# Patient Record
Sex: Male | Born: 1985 | Race: White | Hispanic: No | Marital: Single | Smoking: Never smoker
Health system: Southern US, Community
[De-identification: ages and names within clinical notes are randomized; demographics above are authoritative.]

## PROBLEM LIST (undated history)

## (undated) HISTORY — PX: MOUTH SURGERY: SHX715

---

## 2008-05-11 ENCOUNTER — Emergency Department (HOSPITAL_COMMUNITY): Admission: EM | Admit: 2008-05-11 | Discharge: 2008-05-11 | Payer: Self-pay | Admitting: Emergency Medicine

## 2013-05-10 ENCOUNTER — Emergency Department (HOSPITAL_COMMUNITY)
Admission: EM | Admit: 2013-05-10 | Discharge: 2013-05-10 | Disposition: A | Discharge status: Home / Self Care | Payer: Self-pay | Attending: Emergency Medicine | Admitting: Emergency Medicine

## 2013-05-10 ENCOUNTER — Encounter (HOSPITAL_COMMUNITY): Payer: Self-pay | Admitting: Emergency Medicine

## 2013-05-10 DIAGNOSIS — L03211 Cellulitis of face: Secondary | ICD-10-CM | POA: Insufficient documentation

## 2013-05-10 DIAGNOSIS — L0201 Cutaneous abscess of face: Secondary | ICD-10-CM | POA: Insufficient documentation

## 2013-05-10 MED ORDER — CEPHALEXIN 500 MG PO CAPS: 500.0000 mg | ORAL_CAPSULE | Freq: Four times a day (QID) | ORAL | Status: DC

## 2013-05-10 MED ORDER — SULFAMETHOXAZOLE-TMP DS 800-160 MG PO TABS
1.0000 | ORAL_TABLET | Freq: Once | ORAL | Status: AC
  Administered 2013-05-10: 1 via ORAL
  Filled 2013-05-10: qty 1

## 2013-05-10 MED ORDER — CEPHALEXIN 250 MG PO CAPS
500.0000 mg | ORAL_CAPSULE | Freq: Once | ORAL | Status: AC
  Administered 2013-05-10: 500 mg via ORAL
  Filled 2013-05-10: qty 2

## 2013-05-10 MED ORDER — SULFAMETHOXAZOLE-TRIMETHOPRIM 800-160 MG PO TABS: 1.0000 | ORAL_TABLET | Freq: Two times a day (BID) | ORAL | Status: DC

## 2013-05-10 NOTE — ED Provider Notes (Signed)
History     CSN: [id number]  Arrival date & time 05/10/13  1310   First MD Initiated Contact with Patient 05/10/13 1347      Chief Complaint  Patient presents with  . Facial Swelling    (Consider location/radiation/quality/duration/timing/severity/associated sxs/prior treatment) HPI Eric Bender is a 27 y.o. male who presents to ED with complaint of facial swelling. States swelling started two days ago when he noted a pimple on his right cheek. States since then swelling is worsening. States when squeezed nothing came out. Denies fever, chills, malaise. States tried warm compresses with no improvement. No hx of the same.    History reviewed. No pertinent past medical history.  No past surgical history on file.  No family history on file.  History  Substance Use Topics  . Smoking status: Never Smoker   . Smokeless tobacco: Not on file  . Alcohol Use: Yes      Review of Systems  Constitutional: Negative for fever and chills.  HENT: Positive for facial swelling. Negative for congestion, sore throat, neck pain, neck stiffness and dental problem.   Respiratory: Negative.   Cardiovascular: Negative.   Skin: Positive for color change and wound.  Neurological: Negative for weakness and headaches.    Allergies  Penicillins  Home Medications  No current outpatient prescriptions on file.  BP 137/86  Pulse 81  Temp(Src) 98.3 F (36.8 C)  Resp 16  SpO2 99%  Physical Exam  HENT:  Head: Normocephalic and atraumatic.  Right Ear: External ear normal.  Left Ear: External ear normal.  Nose: Nose normal.  Mouth/Throat: Oropharynx is clear and moist.  About 3cm area of swelling, erythema, induration to the right maxilla. Tender to palpation. Unremarkable dentition  Neck: Neck supple.  Cardiovascular: Normal rate, regular rhythm and normal heart sounds.   Pulmonary/Chest: Effort normal and breath sounds normal. No respiratory distress.  Lymphadenopathy:    He has no  cervical adenopathy.  Neurological: He is alert.  Skin: Skin is warm and dry.    ED Course  Procedures (including critical care time)  Labs Reviewed - No data to display No results found.   1. Facial abscess       MDM  Pt with small abscess vs cellulitis of the right face. Explained to pt that we normally have to I&D these to make them better. Given this is on his face, and at this time, swelling is minimal, he is afebrile, non toxic, will try antibiotics first, with close precautions to return if worsening. Started on clindamycin first, which pt states  He cannot aford. Will start on keflex and bactrim. He has no sinus complaints, no dental issues, no other problems.   Filed Vitals:   05/10/13 1316  BP: 137/86  Pulse: 81  Temp: 98.3 F (36.8 C)  Resp: 16  SpO2: 99%           Lottie Mussel, PA-C 05/10/13 1546

## 2013-05-10 NOTE — ED Notes (Signed)
Rt side of face swelling x2 days thought it was pimple and mashed it

## 2013-05-10 NOTE — ED Notes (Signed)
Patient has right facial swelling since yesterday.  Patient claims he thought it was a pimple coming up yesterday, but he woke up with morning with swelling in R cheek area.

## 2013-05-12 NOTE — ED Provider Notes (Signed)
Medical screening examination/treatment/procedure(s) were performed by non-physician practitioner and as supervising physician I was immediately available for consultation/collaboration.   Richardean Canal, MD 05/12/13 1534

## 2015-09-01 ENCOUNTER — Encounter (HOSPITAL_COMMUNITY): Payer: Self-pay | Admitting: Emergency Medicine

## 2015-09-01 ENCOUNTER — Emergency Department (HOSPITAL_COMMUNITY)
Admission: EM | Admit: 2015-09-01 | Discharge: 2015-09-01 | Disposition: A | Discharge status: Home / Self Care | Payer: Self-pay | Attending: Emergency Medicine | Admitting: Emergency Medicine

## 2015-09-01 ENCOUNTER — Emergency Department (HOSPITAL_COMMUNITY): Payer: Self-pay

## 2015-09-01 DIAGNOSIS — Y998 Other external cause status: Secondary | ICD-10-CM | POA: Insufficient documentation

## 2015-09-01 DIAGNOSIS — S62316A Displaced fracture of base of fifth metacarpal bone, right hand, initial encounter for closed fracture: Secondary | ICD-10-CM | POA: Insufficient documentation

## 2015-09-01 DIAGNOSIS — Y9289 Other specified places as the place of occurrence of the external cause: Secondary | ICD-10-CM | POA: Insufficient documentation

## 2015-09-01 DIAGNOSIS — W2209XA Striking against other stationary object, initial encounter: Secondary | ICD-10-CM | POA: Insufficient documentation

## 2015-09-01 DIAGNOSIS — S6291XA Unspecified fracture of right wrist and hand, initial encounter for closed fracture: Secondary | ICD-10-CM

## 2015-09-01 DIAGNOSIS — S62314A Displaced fracture of base of fourth metacarpal bone, right hand, initial encounter for closed fracture: Secondary | ICD-10-CM | POA: Insufficient documentation

## 2015-09-01 DIAGNOSIS — Y9389 Activity, other specified: Secondary | ICD-10-CM | POA: Insufficient documentation

## 2015-09-01 MED ORDER — HYDROCODONE-ACETAMINOPHEN 5-325 MG PO TABS
1.0000 | ORAL_TABLET | Freq: Once | ORAL | Status: DC
  Filled 2015-09-01: qty 1

## 2015-09-01 MED ORDER — NAPROXEN 500 MG PO TABS: 500.0000 mg | ORAL_TABLET | Freq: Two times a day (BID) | ORAL | Status: DC

## 2015-09-01 NOTE — ED Provider Notes (Signed)
CSN: [id number]     Arrival date & time 09/01/15  4098 History   First MD Initiated Contact with Patient 09/01/15 [phone]     Chief Complaint  Patient presents with  . Hand Injury     (Consider location/radiation/quality/duration/timing/severity/associated sxs/prior Treatment) Patient is a 29 y.o. male presenting with hand injury. The history is provided by the patient.  Hand Injury Associated symptoms: no fever and no neck pain    right hand dominant complaint of pain to the right hand following hitting a wall at about 6 in the morning. Patient with abrasions to the knuckles of the right hand fourth and fifth digit. Patient with pain along that aspect of the hand. Denies any wrist pain and elbow pain or shoulder pain. No other injuries. States that the pain is 8 out of 10.  History reviewed. No pertinent past medical history. History reviewed. No pertinent past surgical history. No family history on file. Social History  Substance Use Topics  . Smoking status: Never Smoker   . Smokeless tobacco: None  . Alcohol Use: Yes    Review of Systems  Constitutional: Negative for fever.  HENT: Negative for congestion.   Eyes: Negative for redness.  Respiratory: Negative for shortness of breath.   Cardiovascular: Negative for chest pain.  Gastrointestinal: Negative for abdominal pain.  Musculoskeletal: Negative for neck pain.  Skin: Positive for wound.  Neurological: Negative for headaches.  Hematological: Does not bruise/bleed easily.  Psychiatric/Behavioral: Negative for confusion.      Allergies  Penicillins and Sulfa antibiotics  Home Medications   Prior to Admission medications   Medication Sig Start Date End Date Taking? Authorizing Provider  acetaminophen (TYLENOL) 500 MG tablet Take 500 mg by mouth every 8 (eight) hours as needed for mild pain, moderate pain, fever or headache.   Yes Historical Provider, MD  pseudoephedrine (SUDAFED) 30 MG tablet Take 30 mg by mouth every  8 (eight) hours as needed for congestion.   Yes Historical Provider, MD  cephALEXin (KEFLEX) 500 MG capsule Take 1 capsule (500 mg total) by mouth 4 (four) times daily. Patient not taking: Reported on 09/01/2015 05/10/13   Tatyana Kirichenko, PA-C  naproxen (NAPROSYN) 500 MG tablet Take 1 tablet (500 mg total) by mouth 2 (two) times daily. 09/01/15   Vanetta Mulders, MD  sulfamethoxazole-trimethoprim (BACTRIM DS) 800-160 MG per tablet Take 1 tablet by mouth 2 (two) times daily. Patient not taking: Reported on 09/01/2015 05/10/13   Tatyana Kirichenko, PA-C   BP 129/65 mmHg  Pulse 102  Temp(Src) 98.2 F (36.8 C) (Oral)  Resp 15  Ht  (1.854 m)  Wt 200 lb (90.719 kg)  BMI 26.39 kg/m2  SpO2 98% Physical Exam  Constitutional: He is oriented to person, place, and time. He appears well-developed and well-nourished. No distress.  HENT:  Head: Normocephalic and atraumatic.  Mouth/Throat: Oropharynx is clear and moist.  Eyes: Conjunctivae and EOM are normal. Pupils are equal, round, and reactive to light.  Neck: Normal range of motion.  Cardiovascular: Normal rate, regular rhythm and normal heart sounds.   Pulmonary/Chest: Effort normal and breath sounds normal. No respiratory distress.  Abdominal: Soft. Bowel sounds are normal. There is no tenderness.  Musculoskeletal: He exhibits edema and tenderness.  Patient was swelling over the fourth and fifth metacarpal area of the right hand. Cap refill to those fingers is intact at 1 second. Good range of motion of the fingers. Tenderness through palpation of all of the metacarpals. No significant tenderness at  the wrist no tenderness at the elbow. Radial pulses 2+. Sensation intact. Superficial abrasions to the fourth and fifth MP joint area dorsally  Neurological: He is alert and oriented to person, place, and time. No cranial nerve deficit. He exhibits normal muscle tone. Coordination normal.  Skin: Skin is warm. No erythema.  Nursing note and vitals  reviewed.   ED Course  Procedures (including critical care time) Labs Review Labs Reviewed - No data to display  Imaging Review Dg Hand Complete Right  09/01/2015   CLINICAL DATA:  Pain in right hand on posterior and medial, ulnar, aspect, between 4th and 5th MCP joints; pt states that he punched a wall couple hours ago; no previous right hand injury  EXAM: RIGHT HAND - COMPLETE 3+ VIEW  COMPARISON:  None.  FINDINGS: Comminuted fracture base of the fifth metacarpal. Apex dorsal angulation and mild displacement. Minimally displaced fracture base of the fourth metacarpal.  IMPRESSION: Fractures at the base of fourth and fifth metacarpals   Electronically Signed   By: Esperanza Heir M.D.   On: 09/01/2015 07:22   I have personally reviewed and evaluated these images and lab results as part of my medical decision-making.   EKG Interpretation None      MDM   Final diagnoses:  Hand fracture, right, closed, initial encounter    Patient punched a wall occurred about 6 in the morning. X-rays reveal fractures at the base of the fourth and fifth metacarpal. Sensation intact distally swelling to the ulnar side of the hand. Reasonable range of motion of the fingers. Radial pulse 2+. Will splint and refer to hand surgery.  Patient is from the [place] area and may contact orthopedics down that way for follow-up in that area that would be fine. Offered patient hydrocodone for the pain but he did not one that which is fine we'll give him a prescription for Naprosyn.    Vanetta Mulders, MD 09/01/15 [phone]

## 2015-09-01 NOTE — ED Notes (Signed)
Patient awaiting ortho

## 2015-09-01 NOTE — ED Notes (Signed)
MD at bedside. 

## 2015-09-01 NOTE — ED Notes (Signed)
Ortho at the bedside.

## 2015-09-01 NOTE — ED Notes (Signed)
Pt from home c/o right hand pain from punching a wall. Bruisng, redness, and swelling noted. Pt tearful in triage.

## 2015-09-01 NOTE — Discharge Instructions (Signed)

## 2015-09-01 NOTE — ED Notes (Signed)
In to give patient pain medication.  Patient refused.  States "yall are trying to get me addicted to fucking pain medication".  Explained to patient that was not what we were trying to do but that we were trying to help with his pain while here.  Patient frustrated and tearful.  States he is a [employer], [employer].  Per friend at bedside patient has been drinking and states "that's why he is acting like this".

## 2015-09-04 ENCOUNTER — Encounter (HOSPITAL_BASED_OUTPATIENT_CLINIC_OR_DEPARTMENT_OTHER): Payer: Self-pay | Admitting: *Deleted

## 2015-09-04 ENCOUNTER — Other Ambulatory Visit: Payer: Self-pay | Admitting: Orthopedic Surgery

## 2015-09-06 ENCOUNTER — Encounter (HOSPITAL_BASED_OUTPATIENT_CLINIC_OR_DEPARTMENT_OTHER)
Admission: RE | Disposition: A | Discharge status: Home / Self Care | Payer: Self-pay | Source: Ambulatory Visit | Attending: Orthopedic Surgery

## 2015-09-06 ENCOUNTER — Ambulatory Visit (HOSPITAL_BASED_OUTPATIENT_CLINIC_OR_DEPARTMENT_OTHER): Payer: Self-pay | Admitting: Certified Registered"

## 2015-09-06 ENCOUNTER — Encounter (HOSPITAL_BASED_OUTPATIENT_CLINIC_OR_DEPARTMENT_OTHER): Payer: Self-pay | Admitting: Anesthesiology

## 2015-09-06 ENCOUNTER — Ambulatory Visit (HOSPITAL_BASED_OUTPATIENT_CLINIC_OR_DEPARTMENT_OTHER)
Admission: RE | Admit: 2015-09-06 | Discharge: 2015-09-06 | Disposition: A | Discharge status: Home / Self Care | Payer: Self-pay | Source: Ambulatory Visit | Attending: Orthopedic Surgery | Admitting: Orthopedic Surgery

## 2015-09-06 DIAGNOSIS — Z880 Allergy status to penicillin: Secondary | ICD-10-CM | POA: Insufficient documentation

## 2015-09-06 DIAGNOSIS — S62316A Displaced fracture of base of fifth metacarpal bone, right hand, initial encounter for closed fracture: Secondary | ICD-10-CM | POA: Insufficient documentation

## 2015-09-06 DIAGNOSIS — W2201XA Walked into wall, initial encounter: Secondary | ICD-10-CM | POA: Insufficient documentation

## 2015-09-06 DIAGNOSIS — S62314A Displaced fracture of base of fourth metacarpal bone, right hand, initial encounter for closed fracture: Secondary | ICD-10-CM | POA: Insufficient documentation

## 2015-09-06 HISTORY — PX: CLOSED REDUCTION FINGER WITH PERCUTANEOUS PINNING: SHX5612

## 2015-09-06 SURGERY — CLOSED REDUCTION, FINGER, WITH PERCUTANEOUS PINNING
Anesthesia: General | Site: Hand | Laterality: Right

## 2015-09-06 MED ORDER — MIDAZOLAM HCL 2 MG/2ML IJ SOLN
INTRAMUSCULAR | Status: AC
  Filled 2015-09-06: qty 4

## 2015-09-06 MED ORDER — ONDANSETRON HCL 4 MG/2ML IJ SOLN
INTRAMUSCULAR | Status: DC | PRN
  Administered 2015-09-06: 4 mg via INTRAVENOUS

## 2015-09-06 MED ORDER — OXYCODONE HCL 5 MG/5ML PO SOLN: 5.0000 mg | Freq: Once | ORAL | Status: AC | PRN

## 2015-09-06 MED ORDER — VANCOMYCIN HCL IN DEXTROSE 1-5 GM/200ML-% IV SOLN
INTRAVENOUS | Status: AC
  Filled 2015-09-06: qty 200

## 2015-09-06 MED ORDER — LACTATED RINGERS IV SOLN
INTRAVENOUS | Status: DC
  Administered 2015-09-06 (×2): via INTRAVENOUS

## 2015-09-06 MED ORDER — FENTANYL CITRATE (PF) 100 MCG/2ML IJ SOLN
INTRAMUSCULAR | Status: AC
  Filled 2015-09-06: qty 4

## 2015-09-06 MED ORDER — FENTANYL CITRATE (PF) 100 MCG/2ML IJ SOLN
50.0000 ug | INTRAMUSCULAR | Status: AC | PRN
  Administered 2015-09-06 (×2): 25 ug via INTRAVENOUS
  Administered 2015-09-06: 100 ug via INTRAVENOUS

## 2015-09-06 MED ORDER — KETOROLAC TROMETHAMINE 30 MG/ML IJ SOLN: 30.0000 mg | Freq: Once | INTRAMUSCULAR | Status: DC

## 2015-09-06 MED ORDER — BUPIVACAINE HCL (PF) 0.25 % IJ SOLN
INTRAMUSCULAR | Status: AC
  Filled 2015-09-06: qty 30

## 2015-09-06 MED ORDER — HYDROMORPHONE HCL 1 MG/ML IJ SOLN
INTRAMUSCULAR | Status: AC
  Filled 2015-09-06: qty 1

## 2015-09-06 MED ORDER — DEXAMETHASONE SODIUM PHOSPHATE 4 MG/ML IJ SOLN
INTRAMUSCULAR | Status: DC | PRN
  Administered 2015-09-06: 10 mg via INTRAVENOUS

## 2015-09-06 MED ORDER — MIDAZOLAM HCL 2 MG/2ML IJ SOLN
1.0000 mg | INTRAMUSCULAR | Status: DC | PRN
  Administered 2015-09-06: 2 mg via INTRAVENOUS

## 2015-09-06 MED ORDER — DEXAMETHASONE SODIUM PHOSPHATE 10 MG/ML IJ SOLN
INTRAMUSCULAR | Status: AC
  Filled 2015-09-06: qty 1

## 2015-09-06 MED ORDER — VANCOMYCIN HCL IN DEXTROSE 1-5 GM/200ML-% IV SOLN
1000.0000 mg | INTRAVENOUS | Status: AC
  Administered 2015-09-06: 1000 mg via INTRAVENOUS

## 2015-09-06 MED ORDER — PROPOFOL 10 MG/ML IV BOLUS
INTRAVENOUS | Status: DC | PRN
  Administered 2015-09-06: 200 mg via INTRAVENOUS

## 2015-09-06 MED ORDER — HYDROCODONE-ACETAMINOPHEN 5-325 MG PO TABS: ORAL_TABLET | ORAL | Status: AC

## 2015-09-06 MED ORDER — PROMETHAZINE HCL 25 MG/ML IJ SOLN: 6.2500 mg | INTRAMUSCULAR | Status: DC | PRN

## 2015-09-06 MED ORDER — OXYCODONE HCL 5 MG PO TABS
ORAL_TABLET | ORAL | Status: AC
  Filled 2015-09-06: qty 1

## 2015-09-06 MED ORDER — SUCCINYLCHOLINE CHLORIDE 20 MG/ML IJ SOLN
INTRAMUSCULAR | Status: AC
  Filled 2015-09-06: qty 1

## 2015-09-06 MED ORDER — LIDOCAINE HCL (CARDIAC) 20 MG/ML IV SOLN
INTRAVENOUS | Status: AC
  Filled 2015-09-06: qty 5

## 2015-09-06 MED ORDER — CHLORHEXIDINE GLUCONATE 4 % EX LIQD: 60.0000 mL | Freq: Once | CUTANEOUS | Status: DC

## 2015-09-06 MED ORDER — GLYCOPYRROLATE 0.2 MG/ML IJ SOLN: 0.2000 mg | Freq: Once | INTRAMUSCULAR | Status: DC | PRN

## 2015-09-06 MED ORDER — LIDOCAINE HCL (CARDIAC) 20 MG/ML IV SOLN
INTRAVENOUS | Status: DC | PRN
  Administered 2015-09-06: 50 mg via INTRAVENOUS

## 2015-09-06 MED ORDER — PROPOFOL 10 MG/ML IV BOLUS
INTRAVENOUS | Status: AC
  Filled 2015-09-06: qty 20

## 2015-09-06 MED ORDER — OXYCODONE HCL 5 MG PO TABS
5.0000 mg | ORAL_TABLET | Freq: Once | ORAL | Status: AC | PRN
  Administered 2015-09-06: 5 mg via ORAL

## 2015-09-06 MED ORDER — SCOPOLAMINE 1 MG/3DAYS TD PT72: 1.0000 | MEDICATED_PATCH | Freq: Once | TRANSDERMAL | Status: DC | PRN

## 2015-09-06 MED ORDER — BUPIVACAINE HCL (PF) 0.25 % IJ SOLN
INTRAMUSCULAR | Status: DC | PRN
  Administered 2015-09-06: 9 mL

## 2015-09-06 MED ORDER — HYDROMORPHONE HCL 1 MG/ML IJ SOLN
0.2500 mg | INTRAMUSCULAR | Status: DC | PRN
  Administered 2015-09-06: 0.25 mg via INTRAVENOUS
  Administered 2015-09-06 (×2): 0.5 mg via INTRAVENOUS
  Administered 2015-09-06: 0.25 mg via INTRAVENOUS

## 2015-09-06 MED ORDER — DIPHENHYDRAMINE HCL 50 MG/ML IJ SOLN
INTRAMUSCULAR | Status: AC
  Filled 2015-09-06: qty 1

## 2015-09-06 MED ORDER — DIPHENHYDRAMINE HCL 50 MG/ML IJ SOLN
25.0000 mg | Freq: Once | INTRAMUSCULAR | Status: AC
  Administered 2015-09-06: 25 mg via INTRAVENOUS

## 2015-09-06 SURGICAL SUPPLY — 46 items
BANDAGE ELASTIC 3 VELCRO ST LF (GAUZE/BANDAGES/DRESSINGS) ×3 IMPLANT
BLADE SURG 15 STRL LF DISP TIS (BLADE) IMPLANT
BLADE SURG 15 STRL SS (BLADE)
BNDG ELASTIC 2 VLCR STRL LF (GAUZE/BANDAGES/DRESSINGS) IMPLANT
BNDG ESMARK 4X9 LF (GAUZE/BANDAGES/DRESSINGS) ×3 IMPLANT
BNDG GAUZE ELAST 4 BULKY (GAUZE/BANDAGES/DRESSINGS) ×3 IMPLANT
CHLORAPREP W/TINT 26ML (MISCELLANEOUS) ×3 IMPLANT
CORDS BIPOLAR (ELECTRODE) IMPLANT
COVER BACK TABLE 60X90IN (DRAPES) ×3 IMPLANT
COVER MAYO STAND STRL (DRAPES) ×3 IMPLANT
CUFF TOURNIQUET SINGLE 18IN (TOURNIQUET CUFF) ×3 IMPLANT
DRAPE EXTREMITY T 121X128X90 (DRAPE) ×3 IMPLANT
DRAPE OEC MINIVIEW 54X84 (DRAPES) ×3 IMPLANT
DRAPE SURG 17X23 STRL (DRAPES) ×3 IMPLANT
GAUZE SPONGE 4X4 12PLY STRL (GAUZE/BANDAGES/DRESSINGS) ×3 IMPLANT
GAUZE XEROFORM 1X8 LF (GAUZE/BANDAGES/DRESSINGS) ×3 IMPLANT
GLOVE BIO SURGEON STRL SZ7.5 (GLOVE) ×3 IMPLANT
GLOVE BIOGEL PI IND STRL 7.0 (GLOVE) ×2 IMPLANT
GLOVE BIOGEL PI IND STRL 8 (GLOVE) ×1 IMPLANT
GLOVE BIOGEL PI IND STRL 8.5 (GLOVE) IMPLANT
GLOVE BIOGEL PI INDICATOR 7.0 (GLOVE) ×4
GLOVE BIOGEL PI INDICATOR 8 (GLOVE) ×2
GLOVE BIOGEL PI INDICATOR 8.5 (GLOVE)
GLOVE ECLIPSE 6.5 STRL STRAW (GLOVE) ×3 IMPLANT
GLOVE SURG ORTHO 8.0 STRL STRW (GLOVE) IMPLANT
GOWN STRL REUS W/ TWL LRG LVL3 (GOWN DISPOSABLE) ×1 IMPLANT
GOWN STRL REUS W/TWL LRG LVL3 (GOWN DISPOSABLE) ×2
GOWN STRL REUS W/TWL XL LVL3 (GOWN DISPOSABLE) ×3 IMPLANT
K-WIRE .045X4 (WIRE) ×9 IMPLANT
NEEDLE HYPO 25X1 1.5 SAFETY (NEEDLE) IMPLANT
NS IRRIG 1000ML POUR BTL (IV SOLUTION) ×3 IMPLANT
PACK BASIN DAY SURGERY FS (CUSTOM PROCEDURE TRAY) ×3 IMPLANT
PAD CAST 3X4 CTTN HI CHSV (CAST SUPPLIES) ×1 IMPLANT
PAD CAST 4YDX4 CTTN HI CHSV (CAST SUPPLIES) IMPLANT
PADDING CAST COTTON 3X4 STRL (CAST SUPPLIES) ×2
PADDING CAST COTTON 4X4 STRL (CAST SUPPLIES)
SLEEVE SCD COMPRESS KNEE MED (MISCELLANEOUS) ×3 IMPLANT
SPLINT PLASTER CAST XFAST 3X15 (CAST SUPPLIES) ×20 IMPLANT
SPLINT PLASTER XTRA FASTSET 3X (CAST SUPPLIES) ×40
STOCKINETTE 4X48 STRL (DRAPES) ×3 IMPLANT
SUT ETHILON 3 0 PS 1 (SUTURE) IMPLANT
SUT ETHILON 4 0 PS 2 18 (SUTURE) IMPLANT
SYR BULB 3OZ (MISCELLANEOUS) IMPLANT
SYR CONTROL 10ML LL (SYRINGE) ×3 IMPLANT
TOWEL OR 17X24 6PK STRL BLUE (TOWEL DISPOSABLE) ×3 IMPLANT
UNDERPAD 30X30 (UNDERPADS AND DIAPERS) IMPLANT

## 2015-09-06 NOTE — Transfer of Care (Signed)
Immediate Anesthesia Transfer of Care Note  Patient: Eric Bender  Procedure(s) Performed: Procedure(s): RIGHT RING AND SMALL METACARPAL CLOSED REDUCTION FINGER WITH PERCUTANEOUS PINNING  (Right)  Patient Location: PACU  Anesthesia Type:General  Level of Consciousness: awake, sedated and patient cooperative  Airway & Oxygen Therapy: Patient Spontanous Breathing and Patient connected to face mask oxygen  Post-op Assessment: Report given to RN and Post -op Vital signs reviewed and stable  Post vital signs: Reviewed and stable  Last Vitals:  Filed Vitals:   09/06/15 1223  BP: 118/73  Pulse: 88  Temp: 36.7 C  Resp: 20    Complications: No apparent anesthesia complications

## 2015-09-06 NOTE — Anesthesia Postprocedure Evaluation (Signed)
  Anesthesia Post-op Note  Patient: Eric Bender  Procedure(s) Performed: Procedure(s): RIGHT RING AND SMALL METACARPAL CLOSED REDUCTION FINGER WITH PERCUTANEOUS PINNING  (Right)  Patient Location: PACU  Anesthesia Type:General  Level of Consciousness: awake and alert   Airway and Oxygen Therapy: Patient Spontanous Breathing  Post-op Pain: none  Post-op Assessment: Post-op Vital signs reviewed              Post-op Vital Signs: Reviewed  Last Vitals:  Filed Vitals:   09/06/15 1530  BP: 127/84  Pulse: 87  Temp:   Resp: 20    Complications: No apparent anesthesia complications

## 2015-09-06 NOTE — Op Note (Signed)
[id number] 

## 2015-09-06 NOTE — Op Note (Signed)
Intra-operative fluoroscopic images in the AP, lateral, and oblique views were taken and evaluated by myself.  Reduction and hardware placement were confirmed.  There was no intraarticular penetration of permanent hardware.  

## 2015-09-06 NOTE — Discharge Instructions (Addendum)

## 2015-09-06 NOTE — Anesthesia Preprocedure Evaluation (Signed)
Anesthesia Evaluation  Patient identified by MRN, date of birth, ID band Patient awake    Reviewed: Allergy & Precautions, NPO status , Patient's Chart, lab work & pertinent test results  Airway Mallampati: I  TM Distance: >3 FB Neck ROM: Full    Dental  (+) Teeth Intact   Pulmonary neg pulmonary ROS,    breath sounds clear to auscultation       Cardiovascular negative cardio ROS   Rhythm:Regular Rate:Normal     Neuro/Psych negative neurological ROS     GI/Hepatic negative GI ROS, Neg liver ROS,   Endo/Other  negative endocrine ROS  Renal/GU negative Renal ROS     Musculoskeletal   Abdominal   Peds  Hematology negative hematology ROS (+)   Anesthesia Other Findings   Reproductive/Obstetrics                             Anesthesia Physical Anesthesia Plan  ASA: I  Anesthesia Plan: General   Post-op Pain Management:    Induction: Intravenous  Airway Management Planned: LMA  Additional Equipment:   Intra-op Plan:   Post-operative Plan:   Informed Consent: I have reviewed the patients History and Physical, chart, labs and discussed the procedure including the risks, benefits and alternatives for the proposed anesthesia with the patient or authorized representative who has indicated his/her understanding and acceptance.     Plan Discussed with: CRNA  Anesthesia Plan Comments:         Anesthesia Quick Evaluation

## 2015-09-06 NOTE — Brief Op Note (Signed)
09/06/2015  2:46 PM  PATIENT:  Eric Bender  29 y.o. male  PRE-OPERATIVE DIAGNOSIS:  right ring and small finger metacarpal fractures  S62.314A, S62.316A  POST-OPERATIVE DIAGNOSIS:  right ring and small finger metacarpal fractures  S62.314A, S62.316A  PROCEDURE:  Procedure(s): RIGHT RING AND SMALL METACARPAL CLOSED REDUCTION FINGER WITH PERCUTANEOUS PINNING  (Right)  SURGEON:  Surgeon(s) and Role:    * Betha Loa, MD - Primary  PHYSICIAN ASSISTANT:   ASSISTANTS: none   ANESTHESIA:   general  EBL:  Total I/O In: 1300 [I.V.:1300] Out: -   BLOOD ADMINISTERED:none  DRAINS: none   LOCAL MEDICATIONS USED:  MARCAINE     SPECIMEN:  No Specimen  DISPOSITION OF SPECIMEN:  N/A  COUNTS:  YES  TOURNIQUET:    DICTATION: .Other Dictation: Dictation Number [id number]  PLAN OF CARE: Discharge to home after PACU  PATIENT DISPOSITION:  PACU - hemodynamically stable.

## 2015-09-06 NOTE — H&P (Signed)
  Eric Bender is an 29 y.o. male.   Chief Complaint: right ring and small metacarpal base fractures HPI: 29 yo rhd male states he punched a wall on 09/01/15 injuring right hand.  Seen at [facility] where XR revealed MC base fractures.  splinted and followed up in office.  He reports no previous injury to hand.  History reviewed. No pertinent past medical history.  Past Surgical History  Procedure Laterality Date  . Mouth surgery      History reviewed. No pertinent family history. Social History:  reports that he has never smoked. He does not have any smokeless tobacco history on file. He reports that he drinks alcohol. He reports that he does not use illicit drugs.  Allergies:  Allergies  Allergen Reactions  . Penicillins Other (See Comments)    Has patient had a PCN reaction causing immediate rash, facial/tongue/throat swelling, SOB or lightheadedness with hypotension: Yes Has patient had a PCN reaction causing severe rash involving mucus membranes or skin necrosis: No Has patient had a PCN reaction that required hospitalization Yes Has patient had a PCN reaction occurring within the last 10 years: No If all of the above answers are "NO", then may proceed with Cephalosporin use.  . Sulfa Antibiotics     Childhood reaction per mother    Medications Prior to Admission  Medication Sig Dispense Refill  . acetaminophen (TYLENOL) 500 MG tablet Take 500 mg by mouth every 8 (eight) hours as needed for mild pain, moderate pain, fever or headache.    . ibuprofen (ADVIL,MOTRIN) 200 MG tablet Take 400 mg by mouth every 8 (eight) hours as needed.    . naproxen (NAPROSYN) 500 MG tablet Take 1 tablet (500 mg total) by mouth 2 (two) times daily. 14 tablet 0  . pseudoephedrine (SUDAFED) 30 MG tablet Take 30 mg by mouth every 8 (eight) hours as needed for congestion.    . Pseudoephedrine-APAP-DM (DAYQUIL PO) Take by mouth.      No results found for this or any previous visit (from the past 48  hour(s)).  No results found.   A comprehensive review of systems was negative except for: Eyes: positive for contacts/glasses  Blood pressure 118/73, pulse 88, temperature 98 F (36.7 C), temperature source Oral, resp. rate 20, height  (1.854 m), weight 94.348 kg (208 lb), SpO2 99 %.  General appearance: alert, cooperative and appears stated age Head: Normocephalic, without obvious abnormality, atraumatic Neck: supple, symmetrical, trachea midline Resp: clear to auscultation bilaterally Cardio: regular rate and rhythm GI: non tender Extremities: intact sensation and capillary refill all digits.  +epl/fpl/io.  no wounds. Pulses: 2+ and symmetric Skin: Skin color, texture, turgor normal. No rashes or lesions Neurologic: Grossly normal Incision/Wound: none  Assessment/Plan Right ring and small finger metacarpal base fractures.  Non operative and operative treatment options were discussed with the patient and patient wishes to proceed with operative treatment. Risks, benefits, and alternatives of surgery were discussed and the patient agrees with the plan of care.   Eric Bender R 09/06/2015, 1:47 PM

## 2015-09-06 NOTE — Op Note (Signed)
NAME:  Eric Bender, Eric Bender NO.:  [id number]  MEDICAL RECORD NO.:  [id number]  LOCATION:                                 FACILITY:  PHYSICIAN:  Betha Loa, MD        DATE OF BIRTH:  Oct 26, 1986  DATE OF PROCEDURE:  09/06/2015 DATE OF DISCHARGE:                              OPERATIVE REPORT   PREOPERATIVE DIAGNOSIS:  Right ring and small finger metacarpal base fractures.  POSTOPERATIVE DIAGNOSIS:  Right ring and small finger metacarpal base fractures.  PROCEDURE:   1. Closed reduction and percutaneous pinning of right ring finger metacarpal base fracture 2. Closed reduction percutaneous pinning right small finger metacarpal base fracture.  SURGEON:  Betha Loa, MD.  ASSISTANT:  None.  ANESTHESIA:  General.  IV FLUIDS:  Per anesthesia flow sheet.  ESTIMATED BLOOD LOSS:  Minimal.  COMPLICATIONS:  None.  SPECIMENS:  None.  TOURNIQUET TIME:  None.  DISPOSITION:  Stable to PACU.  INDICATIONS:  [name] is a 29 year old male who states he punched a wall recently injuring his right hand.  He was seen in the emergency department where radiographs were taken revealing ring and small finger metacarpal base fractures.  He was splinted and followed up in the office.  We discussed nonoperative and operative treatment options.  We elected to proceed with operative fixation.  Risks, benefits, and alternatives of surgery were discussed including risk of blood loss, infection, damage to nerves, vessels, tendons, ligaments, bone, failure of surgery, need for additional surgery, complications with wound healing, continued pain, nonunion, malunion, stiffness.  He voiced understanding of the risks, and elected to proceed.  OPERATIVE COURSE:  After being identified preoperatively by myself, the patient and I agreed upon procedure and site of procedure.  Surgical site was marked.  The risks, benefits, and alternatives of surgery were reviewed and he wished to  proceed.  Surgical consent had been signed. He was given IV vancomycin as preoperative antibiotic prophylaxis due to penicillin allergy.  He was transferred to the operating room and placed on the operating room table in supine position with the right upper extremity on arm board.  General anesthesia was induced by Anesthesiology.  Right upper extremity was prepped and draped in normal sterile orthopedic fashion.  Surgical pause was performed between surgeons, anesthesia, operating staff, and all were in agreement as to the patient, procedure, and site of procedure.  Tourniquet at the proximal aspect of the extremities never inflated.  C-arm was used in AP, lateral, and oblique projections throughout the case to aid in reduction and hardware positioning.  A closed reduction was performed. Three 0.045 inch K-wires were used.  One was advanced across the small finger metacarpal base into the ring finger metacarpal base.  One was advanced distal to the fracture in the small finger metacarpal into the base and then across into the hamate.  The third was advanced across the ring finger metacarpal into the base of the long finger metacarpal. This was adequately stabilized the fractures.  The wrist was placed through a tenodesis, and there was no scissoring.  The fingers were consistent with the opposite hand.  The pins were bent and cut  short. Pin sites were dressed with sterile Xeroform, 4 x 4s, and wrapped with a Kerlix bandage.  The volar and dorsal slab splint including the long, ring, and small fingers were placed with the MPs flexed and the IPs extended.  This was wrapped with Kerlix and Ace bandage.  The operative drapes was broken down.  The patient was awakened from anesthesia safely.  He was transferred back to stretcher and taken to PACU in stable condition.  I will see him back in the office 1 week for postoperative followup.  I will give him Norco 5/325, 1-2 p.o. q.6 hours p.r.n.  pain, dispensed #30.     Betha Loa, MD     KK/MEDQ  D:  09/06/2015  T:  09/06/2015  Job:  [id number]

## 2015-09-06 NOTE — Anesthesia Procedure Notes (Signed)
Procedure Name: LMA Insertion Date/Time: 09/06/2015 1:57 PM Performed by: Gar Gibbon Pre-anesthesia Checklist: Patient identified, Emergency Drugs available, Suction available and Patient being monitored Patient Re-evaluated:Patient Re-evaluated prior to inductionOxygen Delivery Method: Circle System Utilized Preoxygenation: Pre-oxygenation with 100% oxygen Intubation Type: IV induction Ventilation: Mask ventilation without difficulty LMA: LMA inserted LMA Size: 5.0 Number of attempts: 1 Airway Equipment and Method: Bite block Placement Confirmation: positive ETCO2 Tube secured with: Tape Dental Injury: Teeth and Oropharynx as per pre-operative assessment

## 2015-09-09 ENCOUNTER — Encounter (HOSPITAL_BASED_OUTPATIENT_CLINIC_OR_DEPARTMENT_OTHER): Payer: Self-pay | Admitting: Orthopedic Surgery

## 2015-09-12 ENCOUNTER — Ambulatory Visit: Payer: Self-pay | Admitting: Occupational Therapy

## 2015-09-12 ENCOUNTER — Ambulatory Visit: Payer: Self-pay | Attending: Orthopedic Surgery | Admitting: Occupational Therapy

## 2015-09-12 ENCOUNTER — Encounter: Payer: Self-pay | Admitting: Occupational Therapy

## 2015-09-12 DIAGNOSIS — M6289 Other specified disorders of muscle: Secondary | ICD-10-CM | POA: Insufficient documentation

## 2015-09-12 DIAGNOSIS — M79641 Pain in right hand: Secondary | ICD-10-CM | POA: Insufficient documentation

## 2015-09-12 DIAGNOSIS — M25641 Stiffness of right hand, not elsewhere classified: Secondary | ICD-10-CM | POA: Insufficient documentation

## 2015-09-12 DIAGNOSIS — R29898 Other symptoms and signs involving the musculoskeletal system: Secondary | ICD-10-CM

## 2015-09-12 NOTE — Patient Instructions (Signed)
WEARING SCHEDULE:  Wear splint at ALL times except for hygiene care (May remove splint for exercises and then immediately place back on ONLY if directed by the therapist)  PURPOSE:  To prevent movement and for protection until injury can heal  CARE OF SPLINT:  Keep splint away from heat sources including: stove, radiator or furnace, or a car in sunlight. The splint can melt and will no longer fit you properly  Keep away from pets and children  Clean the splint with rubbing alcohol 1-2 times per day.  ** Clean base of pins 2x/day using Q-tips and hydrogen peroxide * During this time, make sure you also clean your hand/arm as instructed by your therapist and/or perform dressing changes as needed. Then dry hand/arm completely before replacing splint. (When cleaning hand/arm, keep it immobilized in same position until splint is replaced)  PRECAUTIONS/POTENTIAL PROBLEMS: *If you notice or experience increased pain, swelling, numbness, or a lingering reddened area from the splint: Contact your therapist immediately by calling [phone]. You must wear the splint for protection, but we will get you scheduled for adjustments as quickly as possible.  (If only straps or hooks need to be replaced and NO adjustments to the splint need to be made, just call the office ahead and let them know you are coming in)  If you have any medical concerns or signs of infection, please call your doctor immediately

## 2015-09-12 NOTE — Therapy (Signed)
[facility] [address] [place], [place], [address] Phone: [phone]   Fax:  [phone]  Occupational Therapy Evaluation  Patient Details  Name: Eric Bender MRN: [id number] Date of Birth: Oct 29, 1986 Referring Kadeja Granada:  Betha Loa, MD  Encounter Date: 09/12/2015      OT End of Session - 09/12/15 1142    Visit Number 1   Number of Visits 8   Date for OT Re-Evaluation 11/11/15   Authorization Type self pay   OT Start Time 1015   OT Stop Time 1130   OT Time Calculation (min) 75 min   Equipment Utilized During Treatment splint   Activity Tolerance Patient tolerated treatment well      History reviewed. No pertinent past medical history.  Past Surgical History  Procedure Laterality Date  . Mouth surgery    . Closed reduction finger with percutaneous pinning Right 09/06/2015    Procedure: RIGHT RING AND SMALL METACARPAL CLOSED REDUCTION FINGER WITH PERCUTANEOUS PINNING ;  Surgeon: Betha Loa, MD;  Location: [facility];  Service: Orthopedics;  Laterality: Right;    There were no vitals filed for this visit.  Visit Diagnosis:  Pain of right hand - Plan: Ot plan of care cert/re-cert  Stiffness of joint, hand, right - Plan: Ot plan of care cert/re-cert  Weakness of right hand - Plan: Ot plan of care cert/re-cert      Subjective Assessment - 09/12/15 1021    Subjective  I don't have pain right now   Patient Stated Goals Get my hand better to play drums   Currently in Pain? Yes   Pain Score 6    Pain Location Hand  ulnar side   Pain Orientation Right   Pain Descriptors / Indicators Sharp   Pain Type Acute pain;Surgical pain   Pain Onset In the past 7 days   Pain Frequency Intermittent   Aggravating Factors  accidentally hitting or pressing against hand   Pain Relieving Factors pain meds           [facility] OT Assessment - 09/12/15 0001    Assessment   Diagnosis s/p CRPP Rt ring  and small metacarpals (due to fractures)   Onset Date 09/06/15  surgery date   Assessment Pt arrived fully wrapped and protected from surgery wrappings   Prior Therapy none   Precautions   Precautions Other (comment)   Precaution Comments No movement Rt wrist, MP joints until cleared by MD   Required Braces or Orthoses Other Brace/Splint   Other Brace/Splint Ulnar gutter splint to include long, ring, and small fingers (per MD orders)   Balance Screen   Has the patient fallen in the past 6 months No   Has the patient had a decrease in activity level because of a fear of falling?  No   Is the patient reluctant to leave their home because of a fear of falling?  No   Home  Environment   Lives With Significant other  who can assist with donning/doffing splint for pin site care   Prior Function   Level of Independence Independent  and driving   Vocation Part time employment   Vocation Requirements works with sound at [employer]   Leisure plays drum   ADL   ADL comments Pt mod I for BADLS, assist for IADLS   Mobility   Mobility Status Independent   Written Expression   Dominant Hand Right   Edema   Edema MILD  OT Treatments/Exercises (OP) - 09/12/15 0001    ADLs   ADL Comments Carefully removed bandages/gauze from surgery, cleaned and dried hand prior to splint fabrication, while keeping hand immobolized. Instructed pt in pin site care and precautions   Splinting   Splinting Fabricated and fitted ulnar gutter splint to include long, ring, and small fingers per MD orders. MD did not specify degrees or joints included, therefore fabricated in safe position (same position he arrived in). Issued splint               OT Education - 09/12/15 1125    Education provided Yes   Education Details splint wear and care, pin site care, precautions   Person(s) Educated Patient   Methods Explanation;Demonstration;Handout   Comprehension Verbalized understanding           OT Short Term Goals - 09/12/15 1148    OT SHORT TERM GOAL #1   Title Independent w/ splint wear and care (due 10/12/15)   Baseline issued, may need adjustments   Time 4   Period Weeks   Status On-going   OT SHORT TERM GOAL #2   Title Pain less than or equal to 3/10 Rt hand during ADLS   Baseline up to 6/10 at rest   Time 4   Period Weeks   Status New   OT SHORT TERM GOAL #3   Title Independent with initial A/ROM HEP   Time 4   Period Weeks   Status New           OT Long Term Goals - 09/12/15 1149    OT LONG TERM GOAL #1   Title Independent w/ updated HEP (DUE 11/11/15)   Time 8   Period Weeks   Status New   OT LONG TERM GOAL #2   Title Pt to return to using Rt hand as dominant hand for BADLS   Time 8   Period Weeks   Status New   OT LONG TERM GOAL #3   Title Pt to have 90% or greater full composite flexion Rt hand   Time 8   Period Weeks   Status New   OT LONG TERM GOAL #4   Title Pt to have 25 lbs grip strength or > for opening containers/jars   Time 8   Period Weeks   Status New               Plan - 09/12/15 1145    Clinical Impression Statement Pt is a 29 y.o. male who presents to outpatient rehab fully wrapped/protected s/p CRPP Rt ring and small fingers due to metacarpal base fractures. Pt was seen for splinting purposes today and issued splint   Pt will benefit from skilled therapeutic intervention in order to improve on the following deficits (Retired) Decreased coordination;Decreased range of motion;Increased edema;Impaired sensation;Decreased knowledge of precautions;Decreased skin integrity;Impaired UE functional use;Pain;Decreased strength   Rehab Potential Good   OT Frequency 1x / week   OT Duration 8 weeks   OT Treatment/Interventions Splinting;Self-care/ADL training;Therapeutic exercise;Patient/family education;Ultrasound;Manual Therapy;Parrafin;DME and/or AE instruction;Therapeutic activities;Fluidtherapy;Scar  mobilization;Moist Heat;Passive range of motion   Plan splint check and adjustment prn, may return for further therapy once cleared by MD   OT Home Exercise Plan 09/12/15: Splint wear and care, pin site care, precautions.    Consulted and Agree with Plan of Care Patient        Problem List There are no active problems to display for this patient.   Kelli Churn, OTR/L  09/12/2015, 11:56 AM  [facility] [address] [place], [place], [address] Phone: [phone]   Fax:  [phone]

## 2015-09-18 ENCOUNTER — Ambulatory Visit: Payer: Self-pay | Attending: Orthopedic Surgery | Admitting: Occupational Therapy

## 2016-01-02 ENCOUNTER — Encounter: Payer: Self-pay | Admitting: Occupational Therapy

## 2016-01-02 NOTE — Therapy (Signed)
[facility] [address] [place], [place], [address] Phone: [phone]   Fax:  [phone]  Patient Details  Name: Eric Bender MRN: [id number] Date of Birth: [birth year] Referring Provider:  No ref. provider found  Encounter Date: 01/02/2016  Pt did not return for further O.T. Services following initial evaluation and splinting on 09/12/15. Will d/c episode of care at this time  Westley, Blass, OTR/L 01/02/2016, 3:15 PM  [facility] [address] [place], [place], [address] Phone: [phone]   Fax:  [phone]

## 2017-04-18 IMAGING — CR DG HAND COMPLETE 3+V*R*
3 series · 3 of 3 positions shown · non-contrast
Comparison: None.

CLINICAL DATA: Pain in right hand on posterior and medial, ulnar,
aspect, between 4th and 5th MCP joints; pt states that he punched a
wall couple hours ago; no previous right hand injury

EXAM:
RIGHT HAND - COMPLETE 3+ VIEW

[x hand pa right]
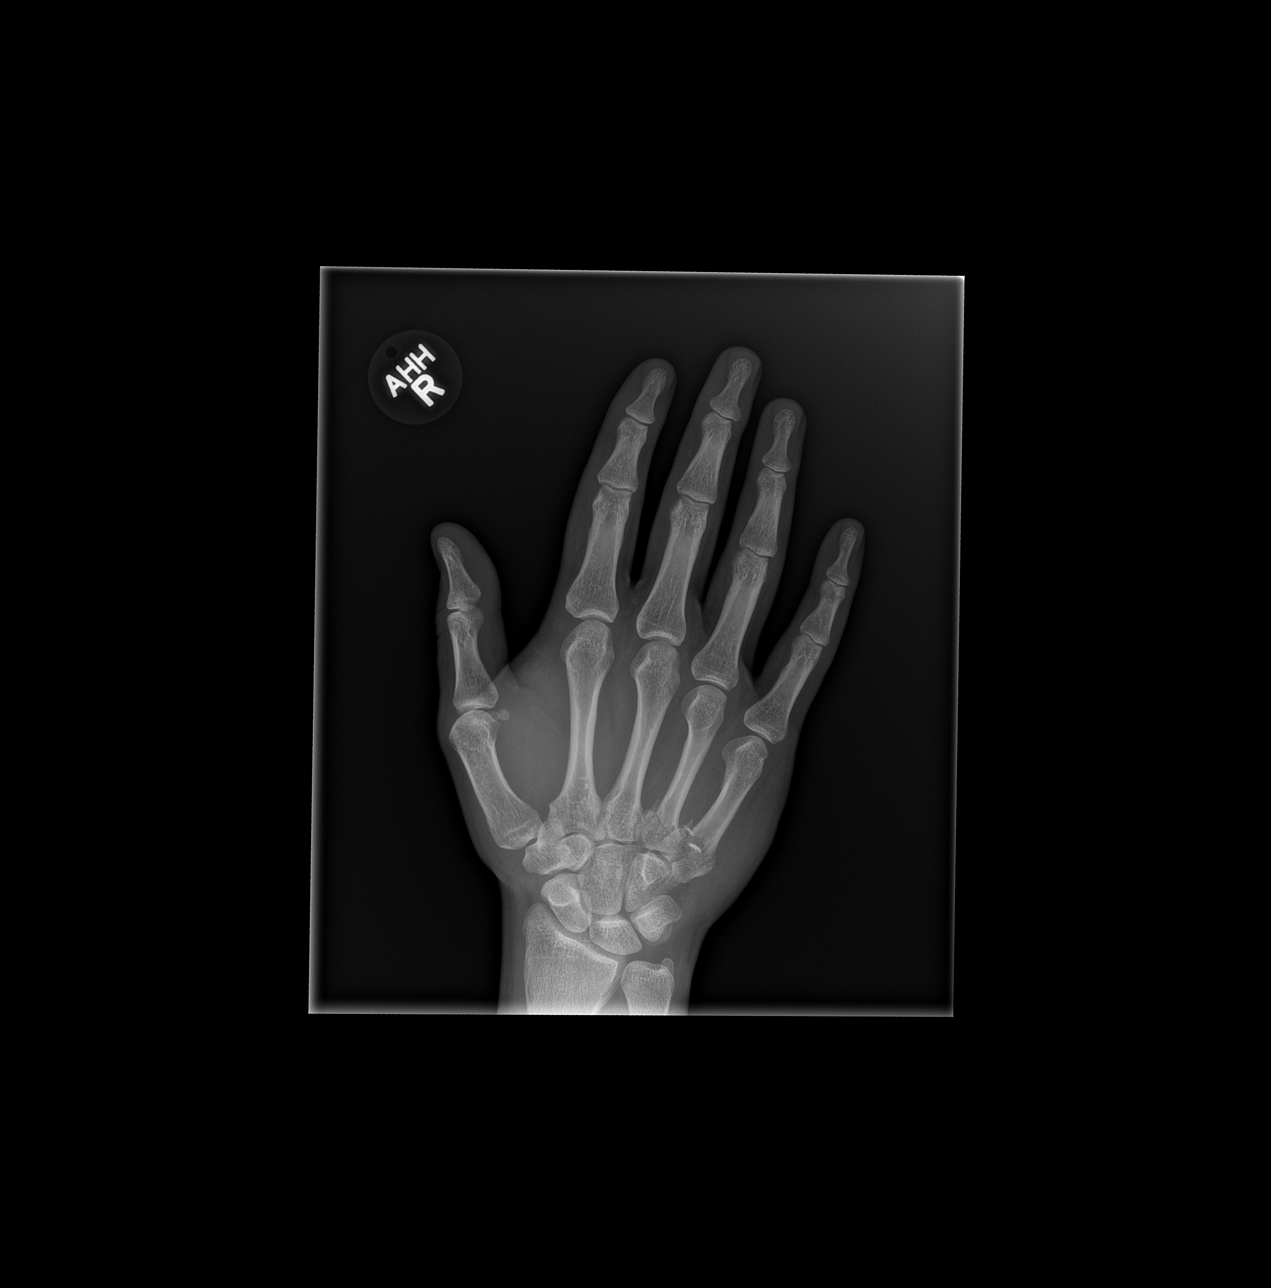

[x hand obl right]
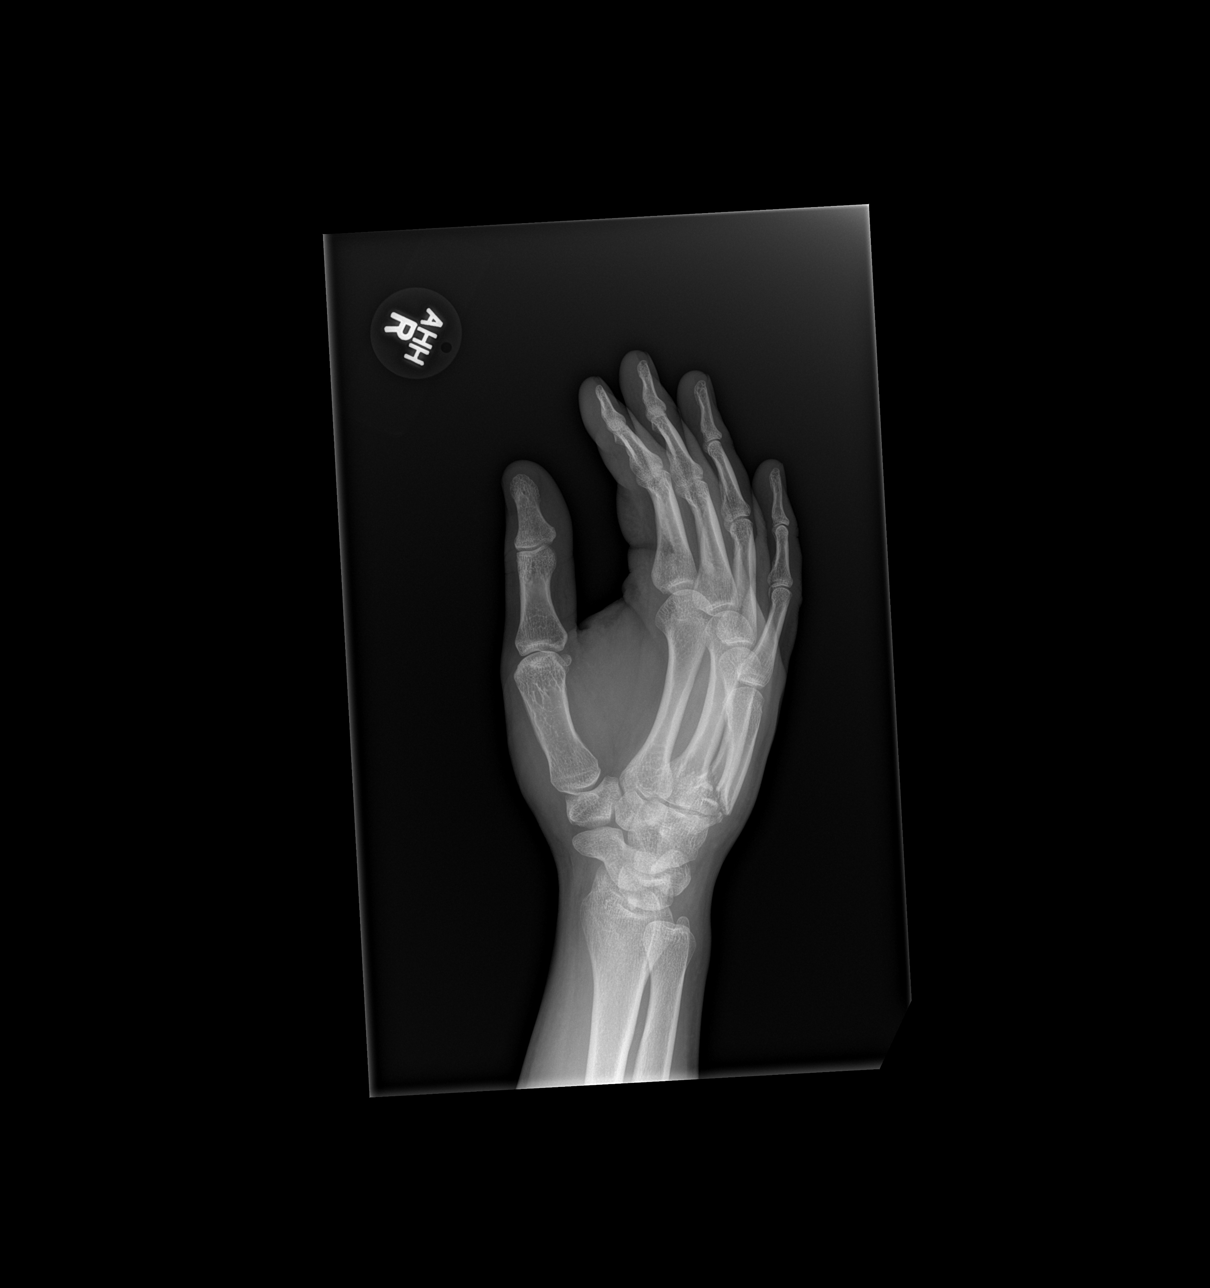

[x hand lat right]
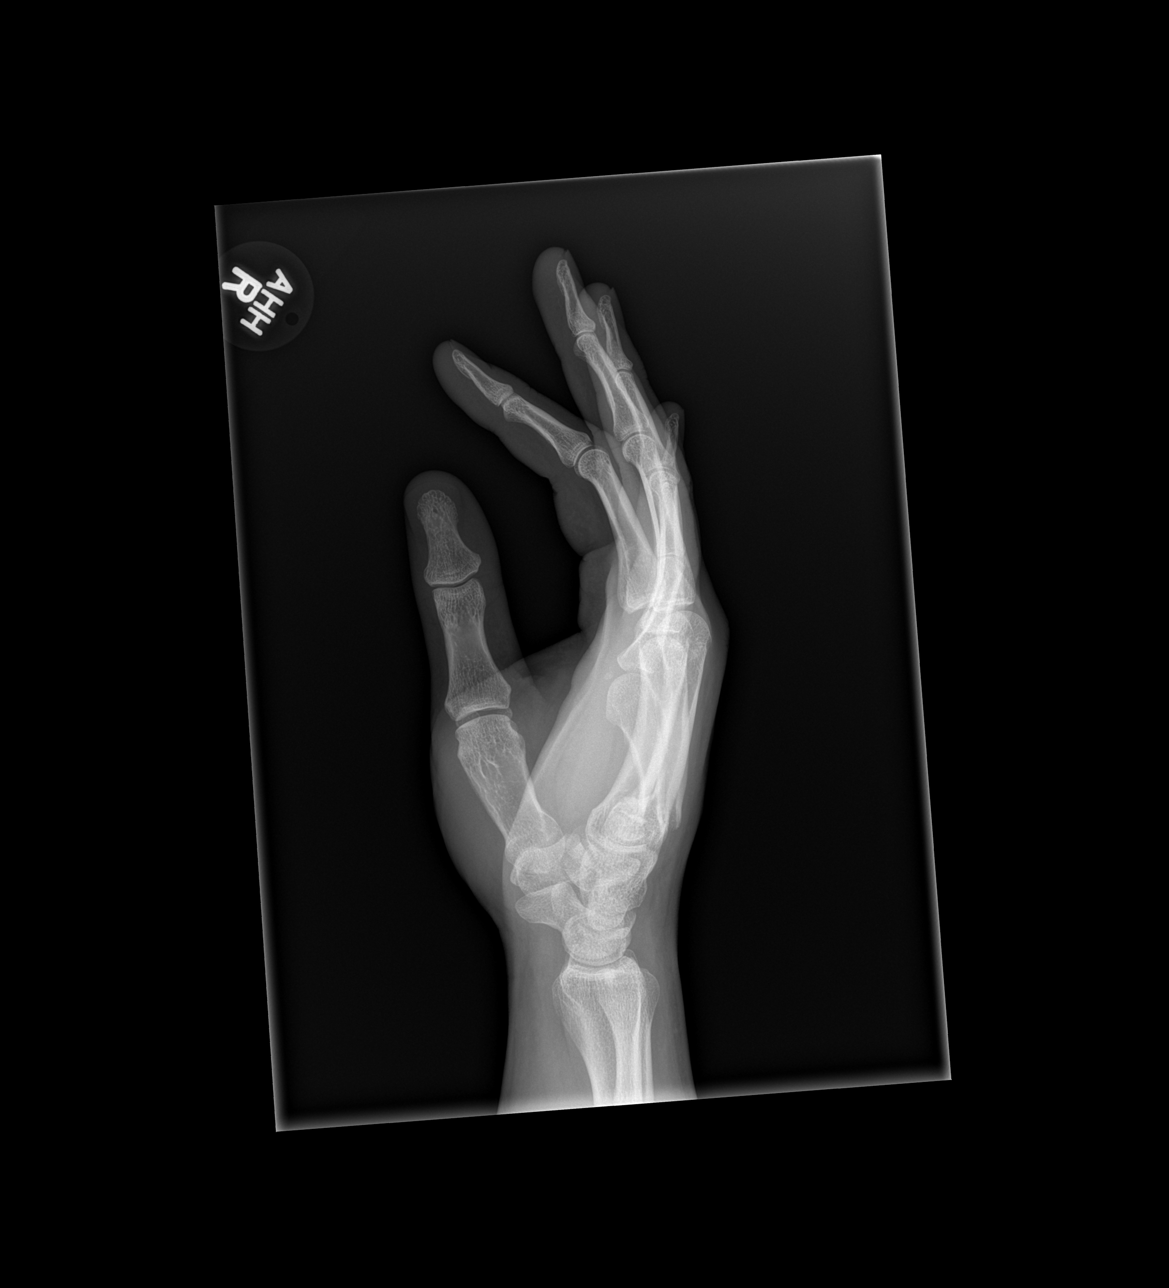

[3 of 3 positions shown; findings below may reference images not displayed]

FINDINGS: Comminuted fracture base of the fifth metacarpal. Apex dorsal
angulation and mild displacement. Minimally displaced fracture base
of the fourth metacarpal.
IMPRESSION: Fractures at the base of fourth and fifth metacarpals
